# Patient Record
Sex: Male | Born: 1965 | Race: White | Hispanic: No | Marital: Married | Smoking: Never smoker
Health system: Southern US, Community
[De-identification: ages and names within clinical notes are randomized; demographics above are authoritative.]

---

## 2009-02-27 ENCOUNTER — Ambulatory Visit: Payer: Self-pay | Admitting: Family Medicine

## 2009-02-27 DIAGNOSIS — R569 Unspecified convulsions: Secondary | ICD-10-CM | POA: Insufficient documentation

## 2009-02-27 DIAGNOSIS — R079 Chest pain, unspecified: Secondary | ICD-10-CM

## 2017-10-31 ENCOUNTER — Ambulatory Visit (INDEPENDENT_AMBULATORY_CARE_PROVIDER_SITE_OTHER): Payer: Managed Care, Other (non HMO) | Admitting: Sports Medicine

## 2017-10-31 ENCOUNTER — Encounter: Payer: Self-pay | Admitting: Sports Medicine

## 2017-10-31 VITALS — BP 123/71 | HR 65 | Ht 74.0 in | Wt 195.0 lb

## 2017-10-31 DIAGNOSIS — Z23 Encounter for immunization: Secondary | ICD-10-CM

## 2017-10-31 DIAGNOSIS — S01511A Laceration without foreign body of lip, initial encounter: Secondary | ICD-10-CM

## 2017-10-31 MED ORDER — CEPHALEXIN 500 MG PO CAPS: 500.0000 mg | ORAL_CAPSULE | Freq: Two times a day (BID) | ORAL | 0 refills | Status: AC

## 2017-10-31 NOTE — Assessment & Plan Note (Signed)
Keflex, meticulous repair, Tdap. Return in 5 days for suture removal.

## 2017-10-31 NOTE — Progress Notes (Signed)
Subjective:    CC: Facial laceration  HPI:  This is a pleasant 52 year old male, he dropped a Spanner on his face today while doing some work under the car, immediate bleeding, laceration visible.  Pain is moderate, persistent, localized without radiation.  I reviewed the past medical history, family history, social history, surgical history, and allergies today and no changes were needed.  Please see the problem list section below in epic for further details.  Past Medical History: History reviewed. No pertinent past medical history. Past Surgical History: History reviewed. No pertinent surgical history. Social History: Social History   Socioeconomic History  . Marital status: Married    Spouse name: Not on file  . Number of children: Not on file  . Years of education: Not on file  . Highest education level: Not on file  Occupational History  . Not on file  Social Needs  . Financial resource strain: Not on file  . Food insecurity:    Worry: Not on file    Inability: Not on file  . Transportation needs:    Medical: Not on file    Non-medical: Not on file  Tobacco Use  . Smoking status: Not on file  Substance and Sexual Activity  . Alcohol use: Not on file  . Drug use: Not on file  . Sexual activity: Not on file  Lifestyle  . Physical activity:    Days per week: Not on file    Minutes per session: Not on file  . Stress: Not on file  Relationships  . Social connections:    Talks on phone: Not on file    Gets together: Not on file    Attends religious service: Not on file    Active member of club or organization: Not on file    Attends meetings of clubs or organizations: Not on file    Relationship status: Not on file  Other Topics Concern  . Not on file  Social History Narrative  . Not on file   Family History: No family history on file. Allergies: No Known Allergies Medications: See med rec.  Review of Systems: No headache, visual changes, nausea,  vomiting, diarrhea, constipation, dizziness, abdominal pain, skin rash, fevers, chills, night sweats, swollen lymph nodes, weight loss, chest pain, body aches, joint swelling, muscle aches, shortness of breath, mood changes, visual or auditory hallucinations.  Objective:    General: Well Developed, well nourished, and in no acute distress.  Neuro: Alert and oriented x3, extra-ocular muscles intact, sensation grossly intact.  HEENT: Normocephalic, atraumatic, pupils equal round reactive to light, neck supple, no masses, no lymphadenopathy, thyroid nonpalpable.  Skin: Warm and dry, 2cm laceration on the right upper lip partially into the vermilion border. Cardiac: Regular rate and rhythm, no murmurs rubs or gallops.  Respiratory: Clear to auscultation bilaterally. Not using accessory muscles, speaking in full sentences.  Abdominal: Soft, nontender, nondistended, positive bowel sounds, no masses, no organomegaly.  Musculoskeletal: Shoulder, elbow, wrist, hip, knee, ankle stable, and with full range of motion.  Laceration repair: Indication: bleeding Location: Right upper lip Size: 2 cm Anesthesia: 1%lidocaine with epi, good effect Wound explored, irrigated, FB removed (if present) Type of suture material: 6-0 Vicryl Number of sutures: #5, simple interrupted Tolerated well Routine postprocedure instructions d/w pt- keep area clean and bandaged, follow up if concerns/spreading erythema/pain.   Follow up for for suture removal.  Impression and Recommendations:    The patient was counselled, risk factors were discussed, anticipatory guidance given.  Laceration of lip Keflex, meticulous repair, Tdap. Return in 5 days for suture removal. ___________________________________________ Troy Roman. Benjamin Stain, M.D., ABFM., CAQSM. Primary Care and Sports Medicine  [facility]  Adjunct Instructor of Family Medicine  [employer]

## 2017-10-31 NOTE — Patient Instructions (Signed)
Laceration Care, Adult A laceration is a cut that goes through all of the layers of the skin and into the tissue that is right under the skin. Some lacerations heal on their own. Others need to be closed with stitches (sutures), staples, skin adhesive strips, or skin glue. Proper laceration care minimizes the risk of infection and helps the laceration to heal better. How is this treated? If sutures or staples were used:  Keep the wound clean and dry.  If you were given a bandage (dressing), you should change it at least one time per day or as told by your health care provider. You should also change it if it becomes wet or dirty.  Keep the wound completely dry for the first 24 hours or as told by your health care provider. After that time, you may shower or bathe. However, make sure that the wound is not soaked in water until after the sutures or staples have been removed.  Clean the wound one time each day or as told by your health care provider: ? Wash the wound with soap and water. ? Rinse the wound with water to remove all soap. ? Pat the wound dry with a clean towel. Do not rub the wound.  After cleaning the wound, apply a thin layer of antibiotic ointmentas told by your health care provider. This will help to prevent infection and keep the dressing from sticking to the wound.  Have the sutures or staples removed as told by your health care provider. If skin adhesive strips were used:  Keep the wound clean and dry.  If you were given a bandage (dressing), you should change it at least one time per day or as told by your health care provider. You should also change it if it becomes dirty or wet.  Do not get the skin adhesive strips wet. You may shower or bathe, but be careful to keep the wound dry.  If the wound gets wet, pat it dry with a clean towel. Do not rub the wound.  Skin adhesive strips fall off on their own. You may trim the strips as the wound heals. Do not remove skin  adhesive strips that are still stuck to the wound. They will fall off in time. If skin glue was used:  Try to keep the wound dry, but you may briefly wet it in the shower or bath. Do not soak the wound in water, such as by swimming.  After you have showered or bathed, gently pat the wound dry with a clean towel. Do not rub the wound.  Do not do any activities that will make you sweat heavily until the skin glue has fallen off on its own.  Do not apply liquid, cream, or ointment medicine to the wound while the skin glue is in place. Using those may loosen the film before the wound has healed.  If you were given a bandage (dressing), you should change it at least one time per day or as told by your health care provider. You should also change it if it becomes dirty or wet.  If a dressing is placed over the wound, be careful not to apply tape directly over the skin glue. Doing that may cause the glue to be pulled off before the wound has healed.  Do not pick at the glue. The skin glue usually remains in place for 5-10 days, then it falls off of the skin. General Instructions  Take over-the-counter and prescription medicines only   as told by your health care provider.  If you were prescribed an antibiotic medicine or ointment, take or apply it as told by your doctor. Do not stop using it even if your condition improves.  To help prevent scarring, make sure to cover your wound with sunscreen whenever you are outside after stitches are removed, after adhesive strips are removed, or when glue remains in place and the wound is healed. Make sure to wear a sunscreen of at least 30 SPF.  Do not scratch or pick at the wound.  Keep all follow-up visits as told by your health care provider. This is important.  Check your wound every day for signs of infection. Watch for: ? Redness, swelling, or pain. ? Fluid, blood, or pus.  Raise (elevate) the injured area above the level of your heart while you  are sitting or lying down, if possible. Contact a health care provider if:  You received a tetanus shot and you have swelling, severe pain, redness, or bleeding at the injection site.  You have a fever.  A wound that was closed breaks open.  You notice a bad smell coming from your wound or your dressing.  You notice something coming out of the wound, such as wood or glass.  Your pain is not controlled with medicine.  You have increased redness, swelling, or pain at the site of your wound.  You have fluid, blood, or pus coming from your wound.  You notice a change in the color of your skin near your wound.  You need to change the dressing frequently due to fluid, blood, or pus draining from the wound.  You develop a new rash.  You develop numbness around the wound. Get help right away if:  You develop severe swelling around the wound.  Your pain suddenly increases and is severe.  You develop painful lumps near the wound or on skin that is anywhere on your body.  You have a red streak going away from your wound.  The wound is on your hand or foot and you cannot properly move a finger or toe.  The wound is on your hand or foot and you notice that your fingers or toes look pale or bluish. This information is not intended to replace advice given to you by your health care provider. Make sure you discuss any questions you have with your health care provider. Document Released: 06/10/2005 Document Revised: 11/10/2015 Document Reviewed: 06/06/2014 Elsevier Interactive Patient Education  2018 [employer]  

## 2017-11-05 ENCOUNTER — Ambulatory Visit: Payer: Self-pay | Admitting: Sports Medicine

## 2017-11-06 ENCOUNTER — Ambulatory Visit (INDEPENDENT_AMBULATORY_CARE_PROVIDER_SITE_OTHER): Payer: Managed Care, Other (non HMO) | Admitting: Sports Medicine

## 2017-11-06 DIAGNOSIS — S01511D Laceration without foreign body of lip, subsequent encounter: Secondary | ICD-10-CM

## 2017-11-06 NOTE — Progress Notes (Signed)
  Subjective: This is a pleasant 52 year old male, we repaired a right upper lip laceration about 6 days ago, he is here for suture removal.  Objective: General: Well-developed, well-nourished, and in no acute distress. Mouth: Laceration is well-healed, sutures removed.  Assessment/plan:   Laceration of lip Looks good, vermilion border is well aligned, sutures removed. Return as needed. ___________________________________________ Troy Roman. Benjamin Stain, M.D., ABFM., CAQSM. Primary Care and Sports Medicine [facility]  Adjunct Instructor of Family Medicine  [employer]

## 2017-11-06 NOTE — Assessment & Plan Note (Signed)
Looks good, vermilion border is well aligned, sutures removed. Return as needed.

## 2020-12-04 ENCOUNTER — Emergency Department (HOSPITAL_COMMUNITY): Payer: Managed Care, Other (non HMO)

## 2020-12-04 ENCOUNTER — Emergency Department (HOSPITAL_COMMUNITY)
Admission: EM | Admit: 2020-12-04 | Discharge: 2020-12-04 | Disposition: A | Discharge status: Home / Self Care | Payer: Managed Care, Other (non HMO) | Attending: Emergency Medicine | Admitting: Emergency Medicine

## 2020-12-04 ENCOUNTER — Encounter (HOSPITAL_COMMUNITY): Payer: Self-pay

## 2020-12-04 DIAGNOSIS — N433 Hydrocele, unspecified: Secondary | ICD-10-CM | POA: Insufficient documentation

## 2020-12-04 DIAGNOSIS — N201 Calculus of ureter: Secondary | ICD-10-CM

## 2020-12-04 DIAGNOSIS — R1032 Left lower quadrant pain: Secondary | ICD-10-CM | POA: Diagnosis present

## 2020-12-04 DIAGNOSIS — N202 Calculus of kidney with calculus of ureter: Secondary | ICD-10-CM | POA: Insufficient documentation

## 2020-12-04 DIAGNOSIS — N200 Calculus of kidney: Secondary | ICD-10-CM | POA: Diagnosis not present

## 2020-12-04 DIAGNOSIS — I700 Atherosclerosis of aorta: Secondary | ICD-10-CM | POA: Insufficient documentation

## 2020-12-04 LAB — CBC WITH DIFFERENTIAL/PLATELET
Abs Immature Granulocytes: 0.06 10*3/uL (ref 0.00–0.07)
Basophils Absolute: 0 10*3/uL (ref 0.0–0.1)
Basophils Relative: 0 %
Eosinophils Absolute: 0 10*3/uL (ref 0.0–0.5)
Eosinophils Relative: 0 %
HCT: 44.7 % (ref 39.0–52.0)
Hemoglobin: 15.4 g/dL (ref 13.0–17.0)
Immature Granulocytes: 1 %
Lymphocytes Relative: 5 %
Lymphs Abs: 0.6 10*3/uL — ABNORMAL LOW (ref 0.7–4.0)
MCH: 30.4 pg (ref 26.0–34.0)
MCHC: 34.5 g/dL (ref 30.0–36.0)
MCV: 88.3 fL (ref 80.0–100.0)
Monocytes Absolute: 0.4 10*3/uL (ref 0.1–1.0)
Monocytes Relative: 4 %
Neutro Abs: 10.6 10*3/uL — ABNORMAL HIGH (ref 1.7–7.7)
Neutrophils Relative %: 90 %
Platelets: 138 10*3/uL — ABNORMAL LOW (ref 150–400)
RBC: 5.06 MIL/uL (ref 4.22–5.81)
RDW: 11.5 % (ref 11.5–15.5)
WBC: 11.7 10*3/uL — ABNORMAL HIGH (ref 4.0–10.5)
nRBC: 0 % (ref 0.0–0.2)

## 2020-12-04 LAB — URINALYSIS, ROUTINE W REFLEX MICROSCOPIC
Bilirubin Urine: NEGATIVE
Glucose, UA: NEGATIVE mg/dL
Hgb urine dipstick: NEGATIVE
Ketones, ur: 5 mg/dL — AB
Leukocytes,Ua: NEGATIVE
Nitrite: NEGATIVE
Protein, ur: NEGATIVE mg/dL
Specific Gravity, Urine: 1.013 (ref 1.005–1.030)
pH: 9 — ABNORMAL HIGH (ref 5.0–8.0)

## 2020-12-04 LAB — COMPREHENSIVE METABOLIC PANEL
ALT: 11 U/L (ref 0–44)
AST: 18 U/L (ref 15–41)
Albumin: 4 g/dL (ref 3.5–5.0)
Alkaline Phosphatase: 66 U/L (ref 38–126)
Anion gap: 11 (ref 5–15)
BUN: 12 mg/dL (ref 6–20)
CO2: 23 mmol/L (ref 22–32)
Calcium: 9.3 mg/dL (ref 8.9–10.3)
Chloride: 106 mmol/L (ref 98–111)
Creatinine, Ser: 1.35 mg/dL — ABNORMAL HIGH (ref 0.61–1.24)
GFR, Estimated: >60 mL/min (ref >60)
Glucose, Bld: 131 mg/dL — ABNORMAL HIGH (ref 70–99)
Potassium: 4.1 mmol/L (ref 3.5–5.1)
Sodium: 140 mmol/L (ref 135–145)
Total Bilirubin: 0.7 mg/dL (ref 0.3–1.2)
Total Protein: 6.6 g/dL (ref 6.5–8.1)

## 2020-12-04 LAB — LIPASE, BLOOD: Lipase: 23 U/L (ref 11–51)

## 2020-12-04 MED ORDER — KETOROLAC TROMETHAMINE 30 MG/ML IJ SOLN
30.0000 mg | Freq: Once | INTRAMUSCULAR | Status: AC
  Administered 2020-12-04: 30 mg via INTRAVENOUS
  Filled 2020-12-04: qty 1

## 2020-12-04 MED ORDER — TAMSULOSIN HCL 0.4 MG PO CAPS: 0.4000 mg | ORAL_CAPSULE | Freq: Every day | ORAL | 0 refills | Status: AC

## 2020-12-04 MED ORDER — SODIUM CHLORIDE 0.9 % IV SOLN: Freq: Once | INTRAVENOUS | Status: AC

## 2020-12-04 MED ORDER — MORPHINE SULFATE (PF) 4 MG/ML IV SOLN
4.0000 mg | Freq: Once | INTRAVENOUS | Status: AC
  Administered 2020-12-04: 4 mg via INTRAVENOUS
  Filled 2020-12-04: qty 1

## 2020-12-04 MED ORDER — ONDANSETRON HCL 4 MG/2ML IJ SOLN
4.0000 mg | Freq: Once | INTRAMUSCULAR | Status: AC
  Administered 2020-12-04: 4 mg via INTRAVENOUS
  Filled 2020-12-04: qty 2

## 2020-12-04 MED ORDER — OXYCODONE-ACETAMINOPHEN 5-325 MG PO TABS: 1.0000 | ORAL_TABLET | Freq: Three times a day (TID) | ORAL | 0 refills | Status: AC | PRN

## 2020-12-04 NOTE — ED Notes (Signed)
Patient transported to CT 

## 2020-12-04 NOTE — Discharge Instructions (Addendum)
Please take Tylenol and ibuprofen for pain.  The CT scan of your abdomen pelvis showed that you have a single urinary stone.  There is also some sediment that may be as second very small stone.  Because this kidney stone is 1 mm it would likely pass on its own.  I given you the information for [facility] which to follow-up with.  Please drink plenty of water.  I have also prescribed you 4 tablets of Percocet to use as needed.  Please only take if absolutely necessary.  Please use Tylenol or ibuprofen for pain.  You may use 600 mg ibuprofen every 6 hours or 1000 mg of Tylenol every 6 hours.  You may choose to alternate between the 2.  This would be most effective.  Not to exceed 4 g of Tylenol within 24 hours.  Not to exceed 3200 mg ibuprofen 24 hours.   I have also prescribed you tamsulosin which will help you pass the urinary stone.  There is some stranding/swelling around your left kidney. If you began having worsening symptoms this could be concerning for an infection ultimately I strongly suspect that this swelling is secondary to the stone.  Your urine does not have any appearance of infection.  You may always return to the ER for new or concerning symptoms.

## 2020-12-04 NOTE — ED Triage Notes (Signed)
Work with sudden onset nausea LLQ/groin pain. Diarrhea this am. Pale,diaphoretic. 12 lead unremarkable.

## 2020-12-04 NOTE — ED Provider Notes (Signed)
[facility]   CSN: [id number] Arrival date & time: 12/04/20  1031     History Chief Complaint  Patient presents with   Abdominal Pain    Dannielle HuhDanny Dorothey BasemanLee Conkel is a 55 y.o. male.  HPI Patient is a 55 year old male with no pertinent past medical history  Patient is presented today with sudden onset of left lower quadrant abdominal pain.  He seems to have pain that radiates both down into his left testicle as well as up into his left flank.  He states that the first pain came on approximately 3 hours before arrival in the ER.  He states that it was severe sudden stabbing in his left flank/left side of his abdomen as he last for only moment.  He states that he went to the restroom because he felt he may need to use the restroom and states he had some loose stool that was not diarrhea denies any melena or hematochezia and denied any rectal pain or discomfort.  No urinary frequency urgency or dysuria he did not notice any hematuria either.  He states he had intermittent stabbing pain since that time several episodes that seem to have no aggravating or mitigating factors.  Does not seem to be worse with movement or position.  He denies any chest pain or shortness of breath no lightheadedness or dizziness no headache cough congestion fevers or chills.  He has had no abdominal surgeries or other surgeries.  He states that his father had a history of kidney stones but he has not yet had any kidney stones.      History reviewed. No pertinent past medical history.  Patient Active Problem List   Diagnosis Date Noted   Laceration of lip 10/31/2017   SEIZURE DISORDER 02/27/2009   RIB PAIN, LEFT SIDED 02/27/2009    No past surgical history on file.     No family history on file.  Social History   Tobacco Use   Smoking status: Never    Passive exposure: Never   Smokeless tobacco: Never  Vaping Use   Vaping Use: Never used  Substance  Use Topics   Alcohol use: Never   Drug use: Never    Home Medications Prior to Admission medications   Medication Sig Start Date End Date Taking? Authorizing Provider  oxyCODONE-acetaminophen (PERCOCET/ROXICET) 5-325 MG tablet Take 1 tablet by mouth every 8 (eight) hours as needed for severe pain. 12/04/20  Yes Maleak Brazzel, Stevphen MeuseWylder S, PA  tamsulosin (FLOMAX) 0.4 MG CAPS capsule Take 1 capsule (0.4 mg total) by mouth daily after supper. 12/04/20  Yes Ginelle Bays S, PA  cephALEXin (KEFLEX) 500 MG capsule Take 1 capsule (500 mg total) by mouth 2 (two) times daily. 10/31/17   Monica Bectonhekkekandam, Thomas J, MD    Allergies    Patient has no known allergies.  Review of Systems   Review of Systems  Constitutional:  Positive for diaphoresis. Negative for chills and fever.  HENT:  Negative for congestion.   Eyes:  Negative for pain.  Respiratory:  Negative for cough and shortness of breath.   Cardiovascular:  Negative for chest pain and leg swelling.  Gastrointestinal:  Positive for abdominal pain and nausea. Negative for vomiting.  Genitourinary:  Positive for flank pain. Negative for dysuria.  Musculoskeletal:  Negative for myalgias.  Skin:  Negative for rash.  Neurological:  Negative for dizziness and headaches.   Physical Exam Updated Vital Signs BP 121/79   Pulse 76  Temp 97.7 F (36.5 C) (Oral)   Resp 15   Ht 6\' 2"  (1.88 m)   Wt 90.7 kg   SpO2 95%   BMI 25.68 kg/m   Physical Exam Vitals and nursing note reviewed.  Constitutional:      Comments: Uncomfortable appearing 55 year old male.  Speaking in full sentences.  Pleasant.  Able answer questions appropriately follow commands.  HENT:     Head: Normocephalic and atraumatic.     Nose: Nose normal.  Eyes:     General: No scleral icterus. Cardiovascular:     Rate and Rhythm: Normal rate and regular rhythm.     Pulses: Normal pulses.     Heart sounds: Normal heart sounds.  Pulmonary:     Effort: Pulmonary effort is normal. No  respiratory distress.     Breath sounds: No wheezing.  Abdominal:     Palpations: Abdomen is soft.     Tenderness: There is abdominal tenderness in the left lower quadrant. There is no right CVA tenderness or left CVA tenderness.     Comments: Left lower quadrant abdominal tenderness.  Abdomen is soft, nonobese.  No guarding or rebound.  No rebound tenderness.  Negative psoas sign or Rovsing.  Negative Murphy sign.  Genitourinary:    Comments: Bilateral testicles are without any significant tenderness.  Faint cremasteric reflex intact. Musculoskeletal:     Cervical back: Normal range of motion.     Right lower leg: No edema.     Left lower leg: No edema.  Skin:    General: Skin is warm and dry.     Capillary Refill: Capillary refill takes less than 2 seconds.  Neurological:     Mental Status: He is alert. Mental status is at baseline.  Psychiatric:        Mood and Affect: Mood normal.        Behavior: Behavior normal.    ED Results / Procedures / Treatments   Labs (all labs ordered are listed, but only abnormal results are displayed) Labs Reviewed  CBC WITH DIFFERENTIAL/PLATELET - Abnormal; Notable for the following components:      Result Value   WBC 11.7 (*)    Platelets 138 (*)    Neutro Abs 10.6 (*)    Lymphs Abs 0.6 (*)    All other components within normal limits  COMPREHENSIVE METABOLIC PANEL - Abnormal; Notable for the following components:   Glucose, Bld 131 (*)    Creatinine, Ser 1.35 (*)    All other components within normal limits  URINALYSIS, ROUTINE W REFLEX MICROSCOPIC - Abnormal; Notable for the following components:   pH 9.0 (*)    Ketones, ur 5 (*)    All other components within normal limits  URINE CULTURE  LIPASE, BLOOD    EKG EKG Interpretation  Date/Time:  Monday December 04 2020 10:48:18 EDT Ventricular Rate:  54 PR Interval:  182 QRS Duration: 91 QT Interval:  439 QTC Calculation: 416 R Axis:   52 Text Interpretation: Sinus rhythm ST elev,  probable normal early repol pattern No previous ECGs available Confirmed by [id number] [phone]) on 12/04/2020 11:24:28 AM  Radiology CT ABDOMEN PELVIS WO CONTRAST  Result Date: 12/04/2020 CLINICAL DATA:  Left lower quadrant abdominal pain of sudden onset. EXAM: CT ABDOMEN AND PELVIS WITHOUT CONTRAST TECHNIQUE: Multidetector CT imaging of the abdomen and pelvis was performed following the standard protocol without IV contrast. COMPARISON:  None. FINDINGS: Lower chest: Normal Hepatobiliary: Normal Pancreas: Normal Spleen: Normal Adrenals/Urinary Tract: Adrenal  glands are normal. Right kidney is normal. Left kidney shows mild perirenal stranding with fullness of the renal collecting system and ureter all the way to the bladder. There is a 1 mm stone just at the UVJ or having just passed into the bladder. No other urinary tract calcification is seen. Stomach/Bowel: Stomach, small bowel and large bowel are normal. Vascular/Lymphatic: Aortic atherosclerosis. No aneurysm. IVC is normal. No adenopathy. Reproductive: Prostate calcifications which are presumed to represent concretions. Difficult to completely exclude the possibility of a stone in the proximal prostatic urethra Other: No free fluid or air. Musculoskeletal: Normal IMPRESSION: Mild stranding around the left kidney. Mild fullness of the left renal collecting system in ureter. 1 mm stone either at the left UVJ or just passed into the bladder. Several small calcifications in the prostate gland that look typical of concretions, but 1 of them is in the midline, therefore I cannot completely rule out the possibility of a stone in the prostatic urethra. Aortic Atherosclerosis (ICD10-I70.0). Electronically Signed   By: Paulina Fusi M.D.   On: 12/04/2020 13:10   US SCROTUM W/DOPPLER  Result Date: 12/04/2020 CLINICAL DATA:  Left scrotal pain EXAM: SCROTAL ULTRASOUND DOPPLER ULTRASOUND OF THE TESTICLES TECHNIQUE: Complete ultrasound examination of the  testicles, epididymis, and other scrotal structures was performed. Color and spectral Doppler ultrasound were also utilized to evaluate blood flow to the testicles. COMPARISON:  None. FINDINGS: Right testicle Measurements: 6.0 x 2.8 x 3.9 cm. No mass or microlithiasis visualized. Left testicle Measurements: 5.7 x 2.6 x 3.4 cm. No mass or microlithiasis visualized. Right epididymis: Normal in size and appearance. No edema or hyperemia. Left epididymis: Normal in size and appearance. No edema or hyperemia. Hydrocele:  Small hydrocele on each side. Varicocele:  None visualized. Pulsed Doppler interrogation of both testes demonstrates normal low resistance arterial and venous waveforms bilaterally. No scrotal wall thickening or evidence scrotal abscess on either side. IMPRESSION: 1. No intratesticular or extratesticular mass on either side. No orchitis or epididymitis on either side. No evident testicular torsion on either side. 2.  Small hydrocele on each side. Electronically Signed   By: Bretta Bang III M.D.   On: 12/04/2020 13:00    Procedures Procedures   Medications Ordered in ED Medications  0.9 %  sodium chloride infusion ( Intravenous Stopped 12/04/20 1335)  morphine 4 MG/ML injection 4 mg (4 mg Intravenous Given 12/04/20 1151)  ondansetron (ZOFRAN) injection 4 mg (4 mg Intravenous Given 12/04/20 1150)  ketorolac (TORADOL) 30 MG/ML injection 30 mg (30 mg Intravenous Given 12/04/20 1528)    ED Course  I have reviewed the triage vital signs and the nursing notes.  Pertinent labs & imaging results that were available during my care of the patient were reviewed by me and considered in my medical decision making (see chart for details).  Clinical Course as of 12/05/20 1510  Mon Dec 04, 2020  1230 Patient is a 55 year old male with past medical history detailed in HPI presented today for severe left lower quadrant/left flank/left testicular pain.  He states it was pretty abrupt in onset.  Seem to  be associated with some diarrhea although he states that it was loose stool and not necessarily diarrhea he had 2 episodes this and had some sensation that he needed to poop more however could not.  Denies any urinary frequency urgency dysuria or hematuria.  Denies any chest pain or shortness of breath.  He was warm and sweaty on arrival denied any numbness or weakness  anywhere no slurred speech and no facial droop was present on physical exam.  Seems to have some suprapubic/left lower quadrant tenderness to palpation no CVA tenderness.  Testicles are nontender symmetric lie however because of pain some concern for torsion.  Will obtain ultrasound as well as CT abdomen pelvis without contrast to evaluate for nephrolithiasis.  Will obtain labs and provide with morphine and Zofran as well. [WF]    Clinical Course User Index [WF] Gailen Shelter, [place]   MDM Rules/Calculators/A&P                          Lab work is notable for mild leukocytosis of 11.7.  Patient is afebrile nontachycardic.  Doubt that patient is suffering from a systemic infection.  CMP with creatinine of 1.35.  This may be patient's baseline.  He has no known history of CKD.  BUN within normal limits doubt prerenal AKI or other acute process.  Lipase is normal-doubt pancreatitis.  Urinalysis without any evidence of infection with negative nitrates and leukocytes.  Notably there is no hemoglobin present on urinalysis however given patient's presentation there is some concern still for nephrolithiasis.  CT scan pending at this time.  IMPRESSION:  Mild stranding around the left kidney. Mild fullness of the left  renal collecting system in ureter. 1 mm stone either at the left UVJ  or just passed into the bladder.     Several small calcifications in the prostate gland that look typical  of concretions, but 1 of them is in the midline, therefore I cannot  completely rule out the possibility of a stone in the prostatic  urethra.      Aortic Atherosclerosis (ICD10-I70.0).  CT abdomen pelvis without contrast shows a 1 mm stone right at the either UVJ or having just passed into the urinary bladder.  There is some questionable stranding around the left kidney may be secondary to recently passed stone or nonspecific finding.  Patient may have small submillimeter stones in the urethra versus some calcifications of the prostate.  EKG normal sinus rhythm with some BER.  This was confirmed by attending physician Dr. Deretha Emory.  Patient is not having any chest pain.  No prior EKG to compare to.  Ultrasound of scrotum was unremarkable of small bilateral hydroceles but no other findings.  Given the patient has significant relief of symptoms with 1 dose of morphine and fluids and Zofran.  Will provide patient with 1 additional dose of analgesia in the form of Toradol.  Will discharge home with 4 tablets of Percocet and recommendations to use Tylenol and ibuprofen.  Also provided tamsulosin.  Given that stone is recently placed in the urinary bladder suspect that patient has likely passed through the worst of the pain.  Given return precautions and information for urology as well as the below medications.  Return precautions discussed and patient was able to teach back.  Tolerated p.o. and ambulatory at time of discharge.  Final Clinical Impression(s) / ED Diagnoses Final diagnoses:  Nephrolithiasis  Ureteral stone    Rx / DC Orders ED Discharge Orders          Ordered    oxyCODONE-acetaminophen (PERCOCET/ROXICET) 5-325 MG tablet  Every 8 hours PRN        12/04/20 1455    tamsulosin (FLOMAX) 0.4 MG CAPS capsule  Daily after supper        12/04/20 1455  Solon Augusta Praesel, [place] 12/05/20 1514    Vanetta Mulders, MD 12/06/20 1845

## 2020-12-05 LAB — URINE CULTURE: Culture: NO GROWTH

## 2021-10-28 IMAGING — CT CT ABD-PELV W/O CM
2 of 4 series · 17 of 46 positions shown, 19 images · non-contrast
Comparison: None.

CLINICAL DATA: Left lower quadrant abdominal pain of sudden onset.

EXAM:
CT ABDOMEN AND PELVIS WITHOUT CONTRAST
TECHNIQUE: Multidetector CT imaging of the abdomen and pelvis was performed
following the standard protocol without IV contrast.

[Series 3: coronal · coronal · 0.89mm/px · 3 of 95 slices shown]
[im 32/95  soft-tissue]
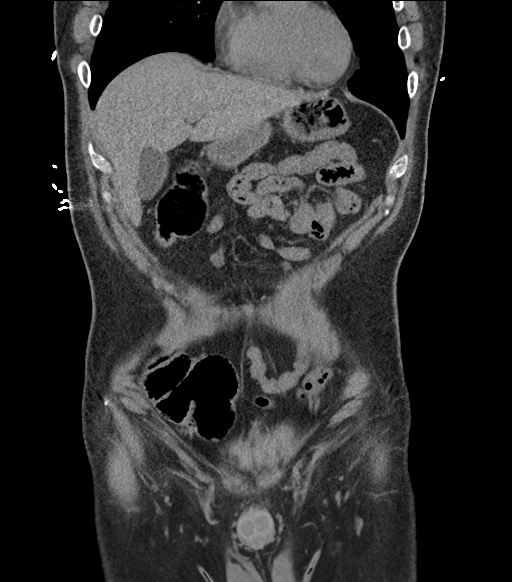
[im 42/95  soft-tissue]
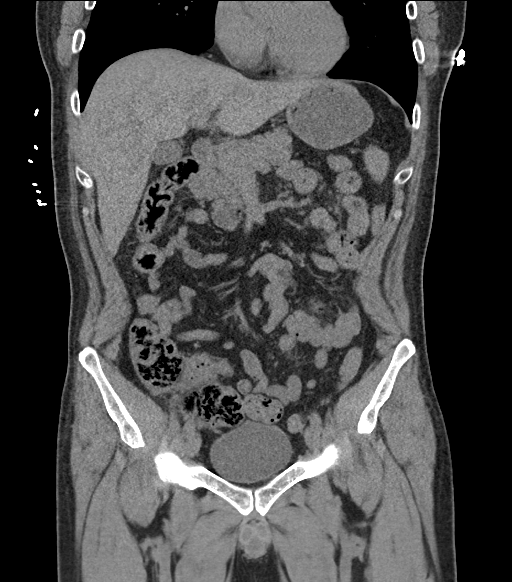
[im 53/95  soft-tissue]
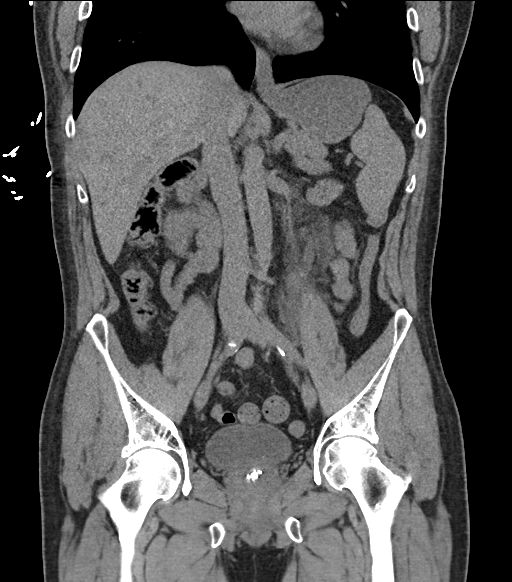

[Series 5: renal stone 5.0 · axial · 0.82mm/px · z∈[+777,+1247]mm · 14 of 104 slices shown, 16 images]
[im 5/104  soft-tissue]
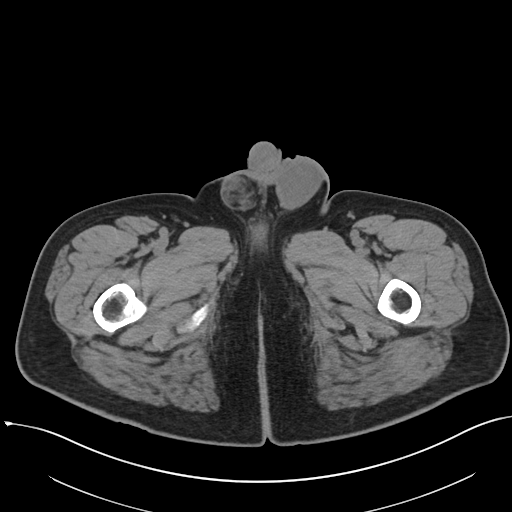
[im 5/104  bone]
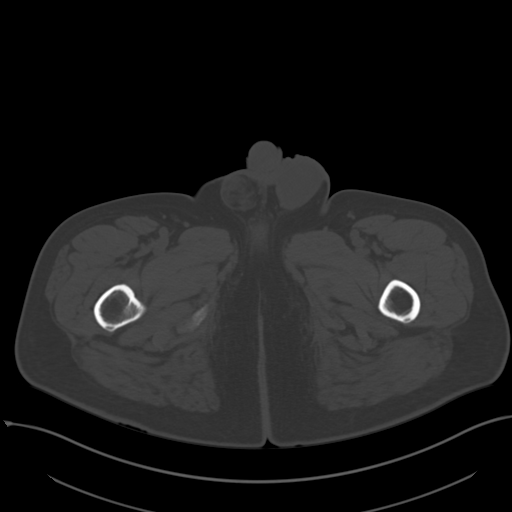
[im 13/104  soft-tissue]
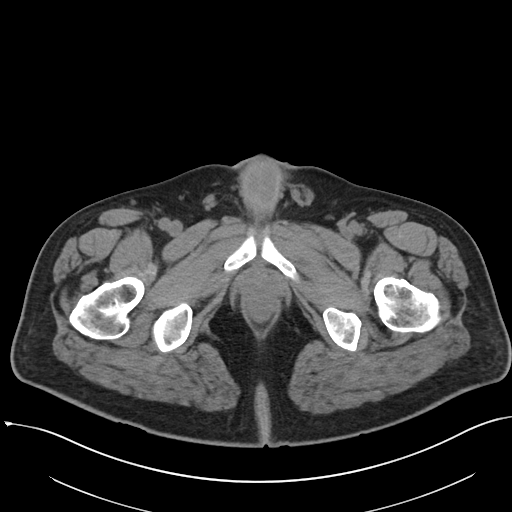
[im 21/104  soft-tissue]
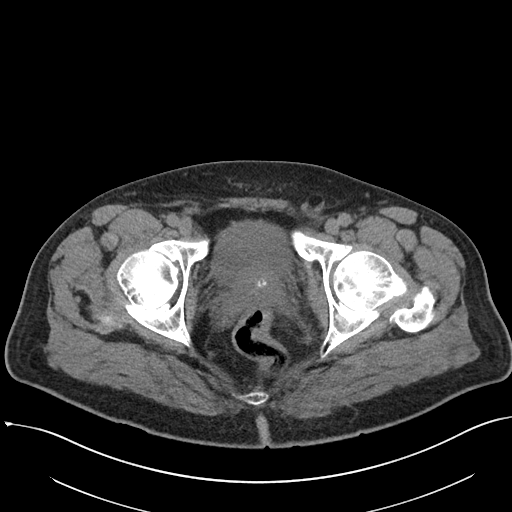
[im 29/104  soft-tissue]
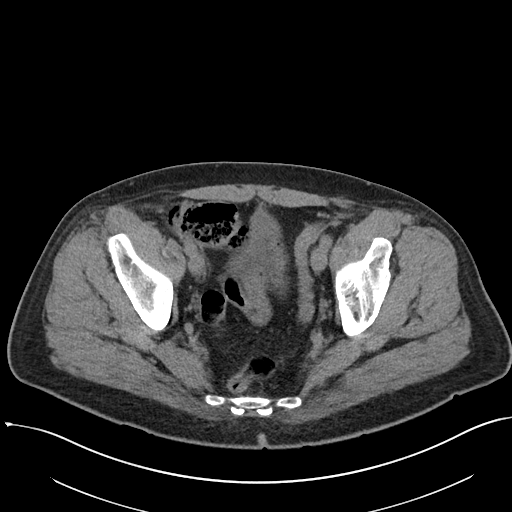
[im 33/104  soft-tissue]
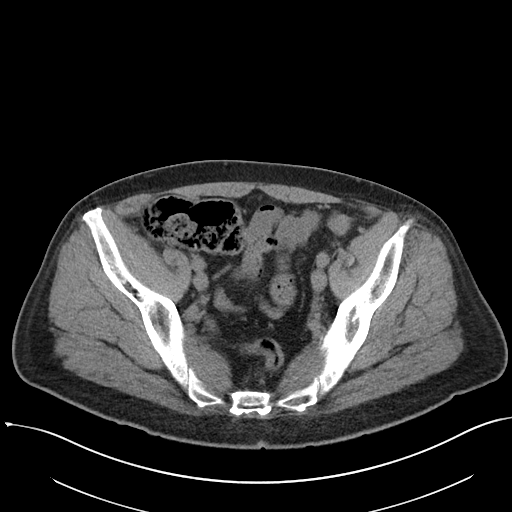
[im 42/104  soft-tissue]
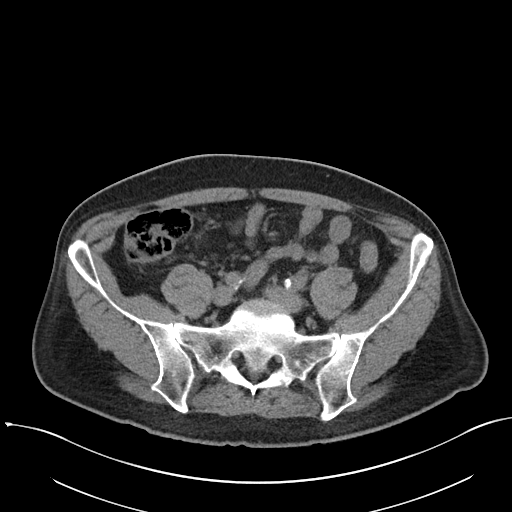
[im 50/104  soft-tissue]
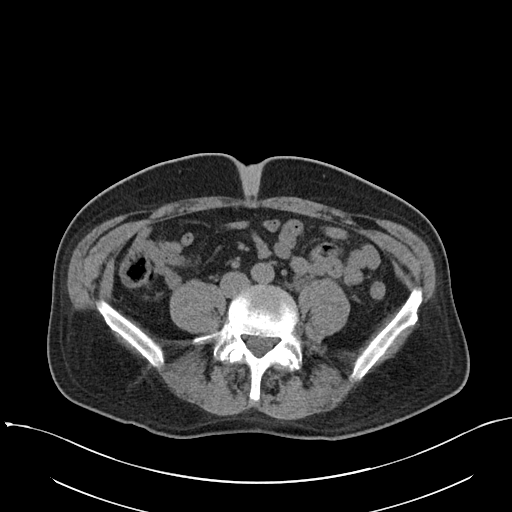
[im 54/104  soft-tissue]
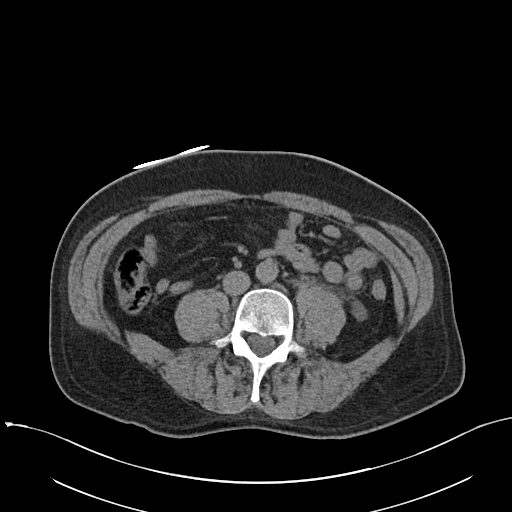
[im 62/104  soft-tissue]
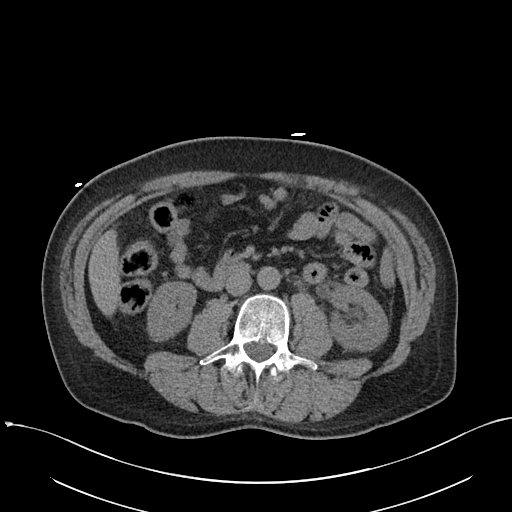
[im 62/104  bone]
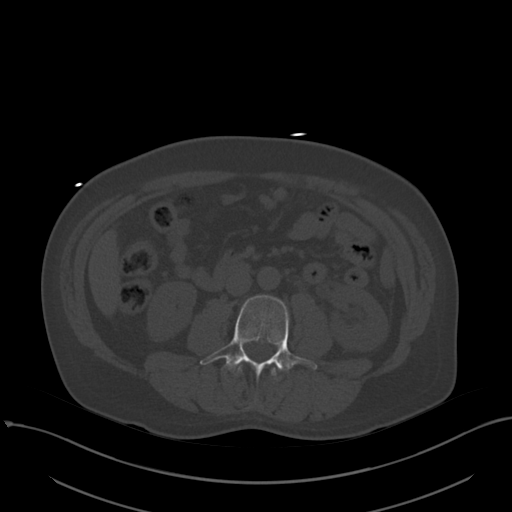
[im 71/104  soft-tissue]
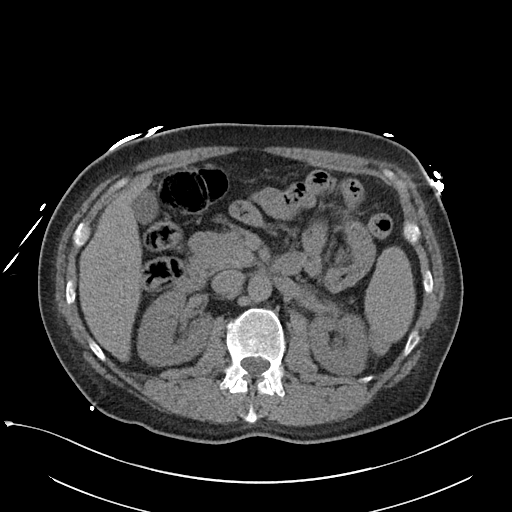
[im 79/104  soft-tissue]
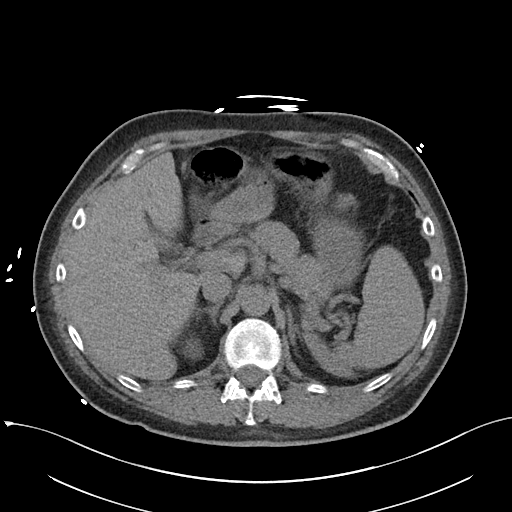
[im 83/104  soft-tissue]
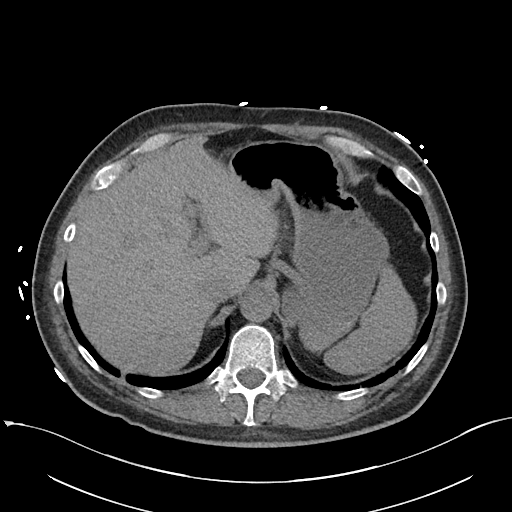
[im 91/104  soft-tissue]
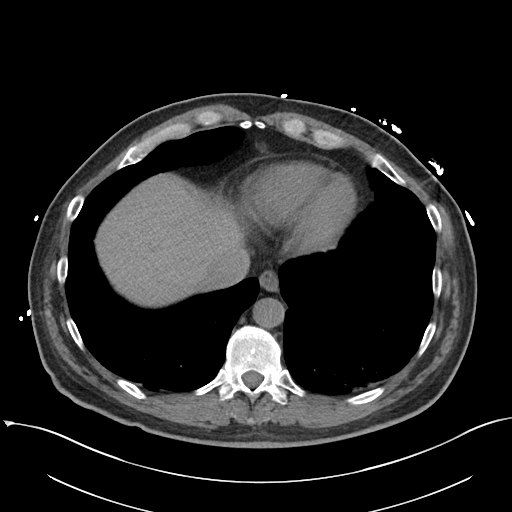
[im 99/104  soft-tissue]
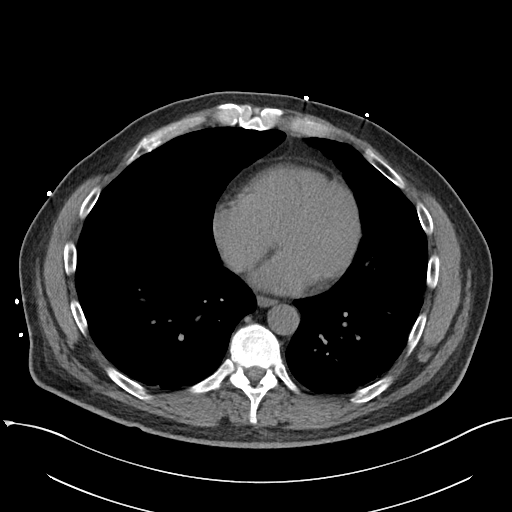

[17 of 46 positions shown; findings below may reference images not displayed]

FINDINGS: Lower chest: Normal

Hepatobiliary: Normal

Pancreas: Normal

Spleen: Normal

Adrenals/Urinary Tract: Adrenal glands are normal. Right kidney is
normal. Left kidney shows mild perirenal stranding with fullness of
the renal collecting system and ureter all the way to the bladder.
There is a 1 mm stone just at the UVJ or having just passed into the
bladder. No other urinary tract calcification is seen.

Stomach/Bowel: Stomach, small bowel and large bowel are normal.

Vascular/Lymphatic: Aortic atherosclerosis. No aneurysm. IVC is
normal. No adenopathy.

Reproductive: Prostate calcifications which are presumed to
represent concretions. Difficult to completely exclude the
possibility of a stone in the proximal prostatic urethra

Other: No free fluid or air.

Musculoskeletal: Normal
IMPRESSION: Mild stranding around the left kidney. Mild fullness of the left
renal collecting system in ureter. 1 mm stone either at the left UVJ
or just passed into the bladder.

Several small calcifications in the prostate gland that look typical
of concretions, but 1 of them is in the midline, therefore I cannot
completely rule out the possibility of a stone in the prostatic
urethra.

Aortic Atherosclerosis ([id number]).

## 2021-10-28 IMAGING — US US SCROTUM W/ DOPPLER COMPLETE
1 series · 14 of 25 positions shown · non-contrast
Comparison: None.

CLINICAL DATA: Left scrotal pain

EXAM:
SCROTAL ULTRASOUND
DOPPLER ULTRASOUND OF THE TESTICLES
TECHNIQUE: Complete ultrasound examination of the testicles, epididymis, and
other scrotal structures was performed. Color and spectral Doppler
ultrasound were also utilized to evaluate blood flow to the
testicles.

[Series 1: us scrotum w/doppler · 14 of 94 slices shown]
[im 1/94]
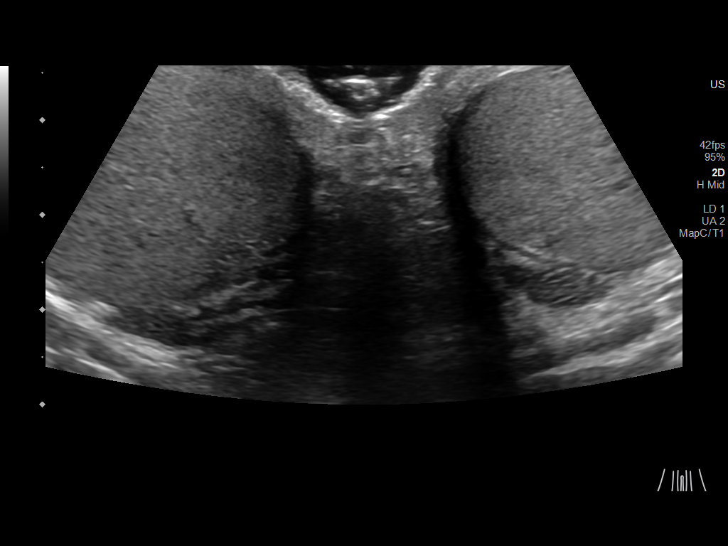
[im 8/94]
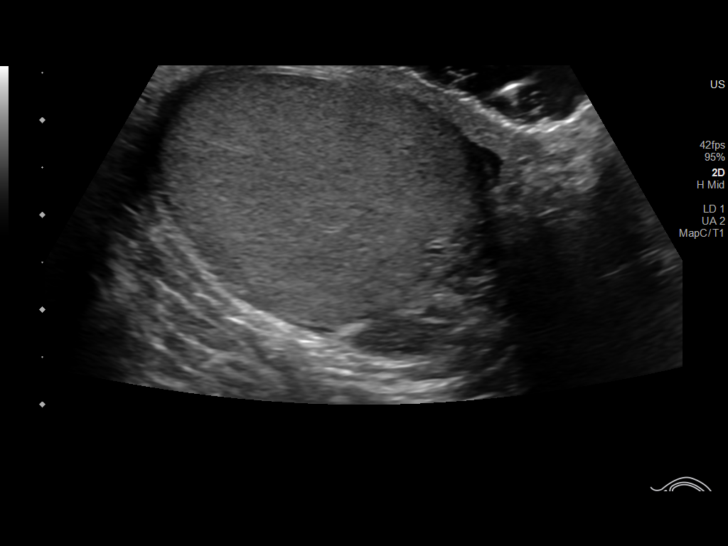
[im 16/94]
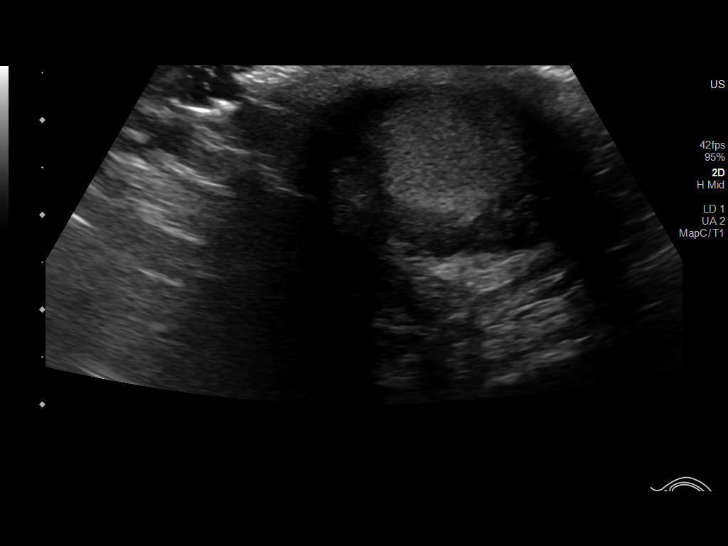
[im 24/94]
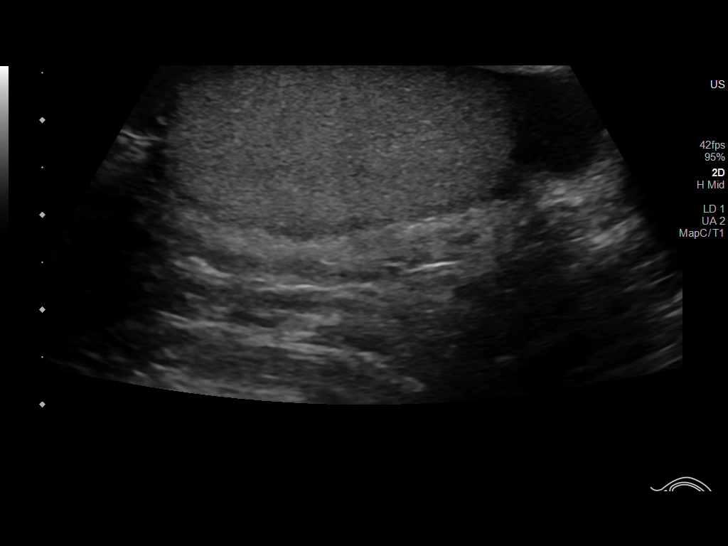
[im 32/94]
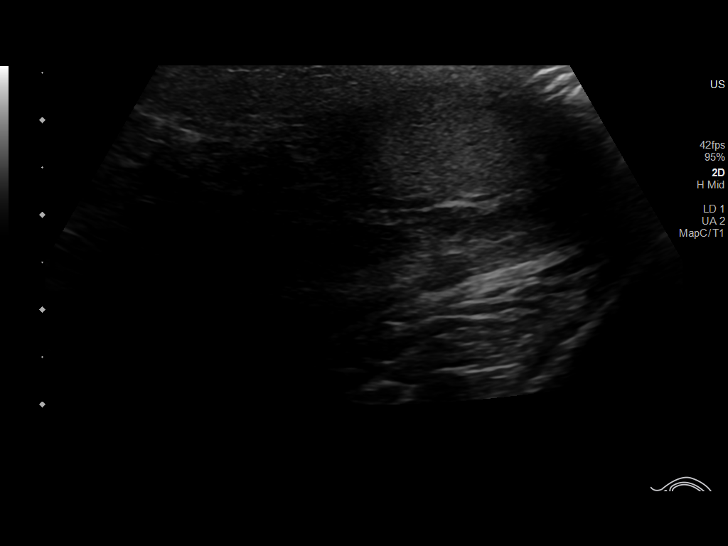
[im 35/94]
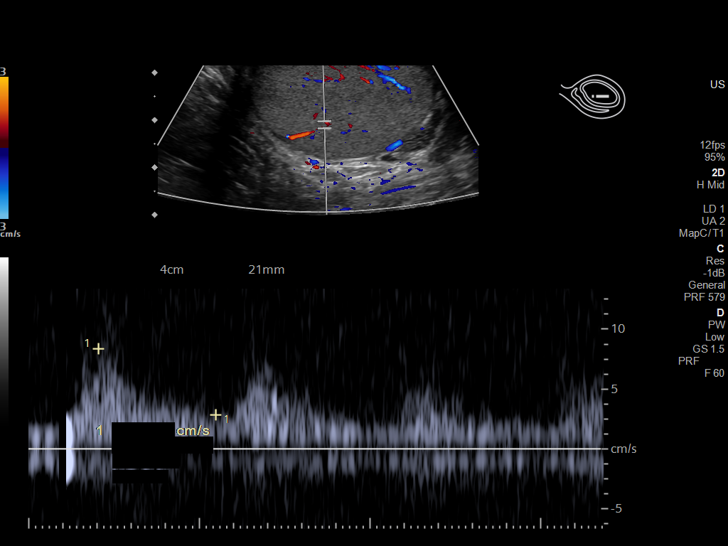
[im 43/94]
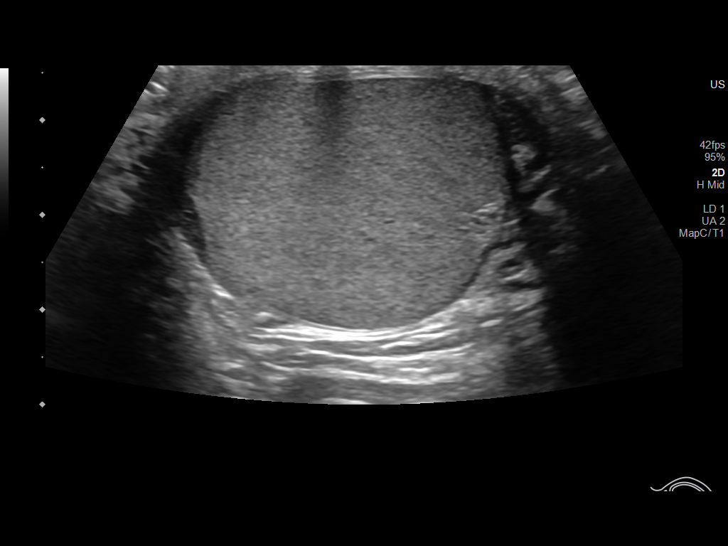
[im 51/94]
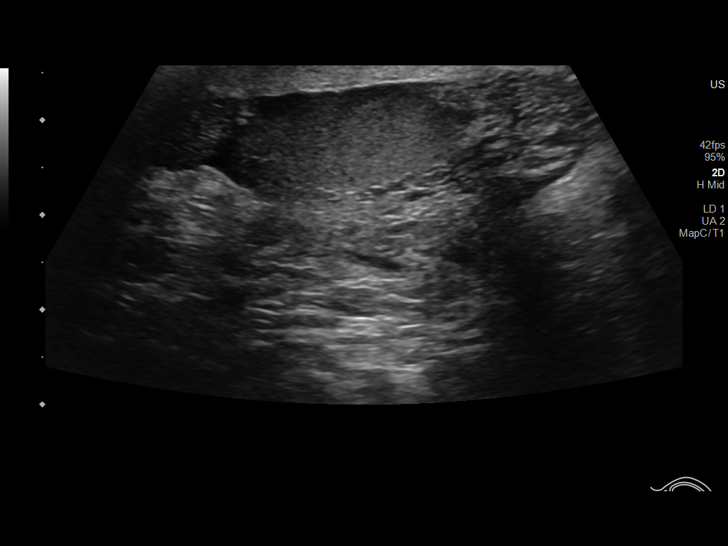
[im 59/94]
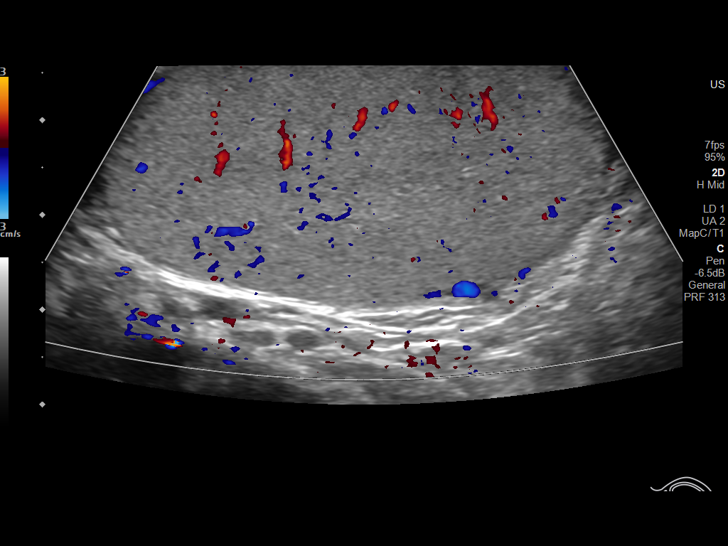
[im 63/94]
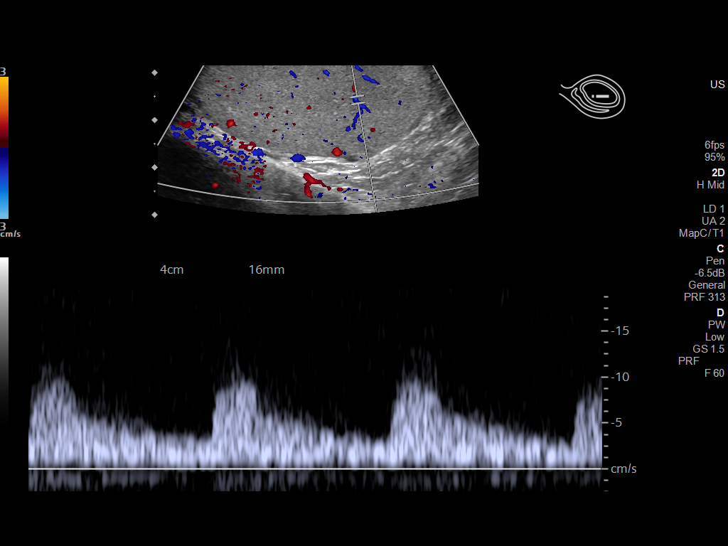
[im 70/94]
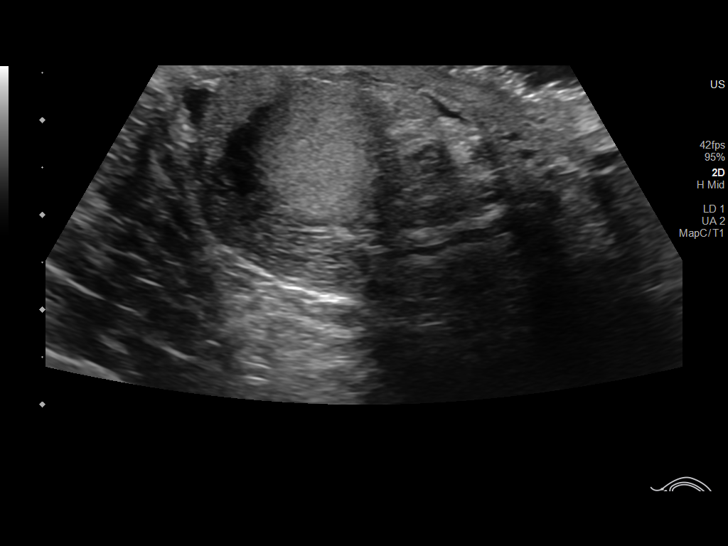
[im 78/94]
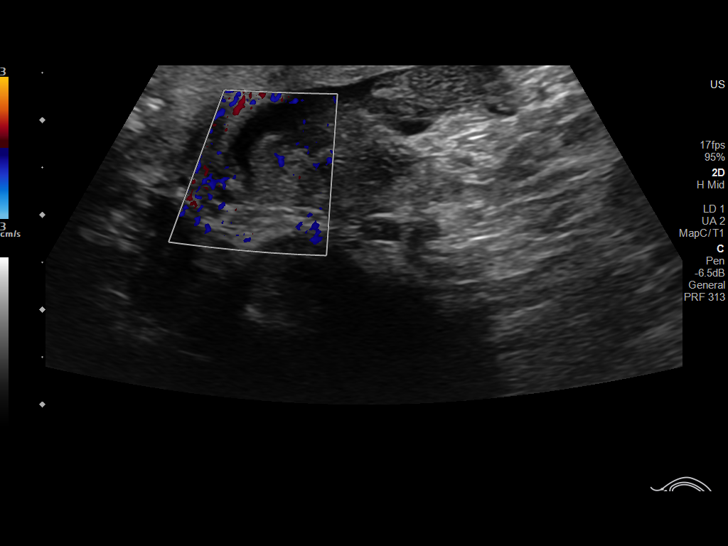
[im 86/94]
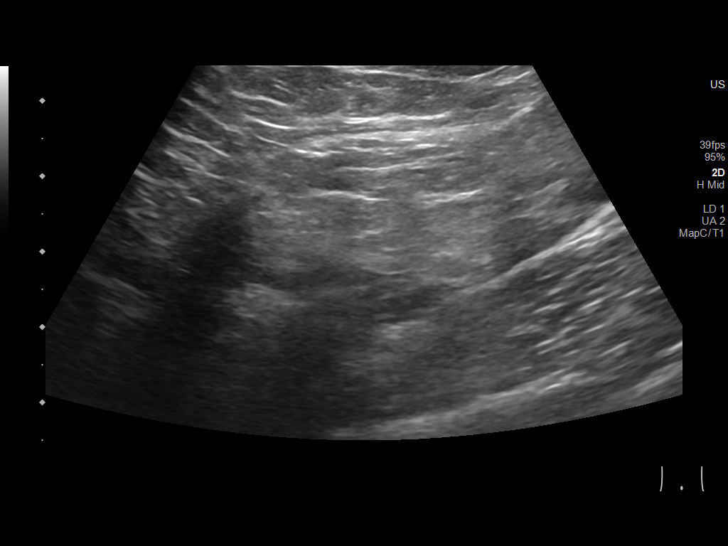
[im 94/94]
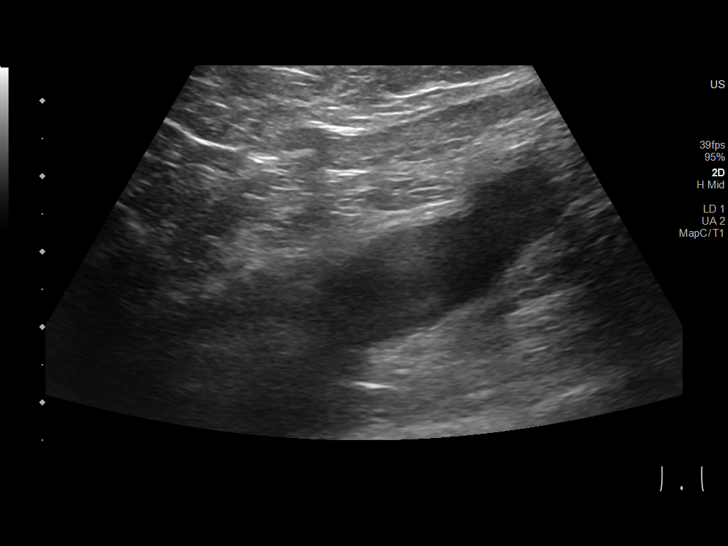

[14 of 25 positions shown; findings below may reference images not displayed]

FINDINGS: Right testicle

Measurements: 6.0 x 2.8 x 3.9 cm. No mass or microlithiasis
visualized.

Left testicle

Measurements: 5.7 x 2.6 x 3.4 cm. No mass or microlithiasis
visualized.

Right epididymis: Normal in size and appearance. No edema or
hyperemia.

Left epididymis: Normal in size and appearance. No edema or
hyperemia.

Hydrocele:  Small hydrocele on each side.

Varicocele:  None visualized.

Pulsed Doppler interrogation of both testes demonstrates normal low
resistance arterial and venous waveforms bilaterally.

No scrotal wall thickening or evidence scrotal abscess on either
side.
IMPRESSION: 1. No intratesticular or extratesticular mass on either side. No
orchitis or epididymitis on either side. No evident testicular
torsion on either side.

2.  Small hydrocele on each side.
